# Patient Record
Sex: Male | Born: 1955 | Race: White | Marital: Married | Smoking: Former smoker
Health system: Southern US, Community
[De-identification: ages and names within clinical notes are randomized; demographics above are authoritative.]

## PROBLEM LIST (undated history)

## (undated) DIAGNOSIS — E781 Pure hyperglyceridemia: Secondary | ICD-10-CM

## (undated) DIAGNOSIS — I10 Essential (primary) hypertension: Secondary | ICD-10-CM

## (undated) DIAGNOSIS — F419 Anxiety disorder, unspecified: Secondary | ICD-10-CM

## (undated) DIAGNOSIS — E119 Type 2 diabetes mellitus without complications: Secondary | ICD-10-CM

## (undated) DIAGNOSIS — F329 Major depressive disorder, single episode, unspecified: Secondary | ICD-10-CM

## (undated) DIAGNOSIS — F32A Depression, unspecified: Secondary | ICD-10-CM

## (undated) HISTORY — PX: CHOLECYSTECTOMY: SHX55

## (undated) HISTORY — PX: APPENDECTOMY: SHX54

---

## 2010-09-14 ENCOUNTER — Emergency Department (HOSPITAL_COMMUNITY)
Admission: EM | Admit: 2010-09-14 | Discharge: 2010-09-14 | Disposition: A | Discharge status: Home / Self Care | Payer: BC Managed Care – HMO | Attending: Emergency Medicine | Admitting: Emergency Medicine

## 2010-09-14 ENCOUNTER — Emergency Department (HOSPITAL_COMMUNITY): Payer: BC Managed Care – HMO

## 2010-09-14 DIAGNOSIS — R319 Hematuria, unspecified: Secondary | ICD-10-CM | POA: Insufficient documentation

## 2010-09-14 DIAGNOSIS — F329 Major depressive disorder, single episode, unspecified: Secondary | ICD-10-CM | POA: Insufficient documentation

## 2010-09-14 DIAGNOSIS — I10 Essential (primary) hypertension: Secondary | ICD-10-CM | POA: Insufficient documentation

## 2010-09-14 DIAGNOSIS — R141 Gas pain: Secondary | ICD-10-CM | POA: Insufficient documentation

## 2010-09-14 DIAGNOSIS — R142 Eructation: Secondary | ICD-10-CM | POA: Insufficient documentation

## 2010-09-14 DIAGNOSIS — R110 Nausea: Secondary | ICD-10-CM | POA: Insufficient documentation

## 2010-09-14 DIAGNOSIS — R109 Unspecified abdominal pain: Secondary | ICD-10-CM | POA: Insufficient documentation

## 2010-09-14 DIAGNOSIS — E7800 Pure hypercholesterolemia, unspecified: Secondary | ICD-10-CM | POA: Insufficient documentation

## 2010-09-14 DIAGNOSIS — F3289 Other specified depressive episodes: Secondary | ICD-10-CM | POA: Insufficient documentation

## 2010-09-14 DIAGNOSIS — N329 Bladder disorder, unspecified: Secondary | ICD-10-CM | POA: Insufficient documentation

## 2010-09-14 DIAGNOSIS — R197 Diarrhea, unspecified: Secondary | ICD-10-CM | POA: Insufficient documentation

## 2010-09-14 LAB — COMPREHENSIVE METABOLIC PANEL
BUN: 15 mg/dL (ref 6–23)
CO2: 27 mEq/L (ref 19–32)
Chloride: 102 mEq/L (ref 96–112)
Creatinine, Ser: 0.82 mg/dL (ref 0.4–1.5)
GFR calc non Af Amer: >60 mL/min (ref >60)
Glucose, Bld: 116 mg/dL — ABNORMAL HIGH (ref 70–99)
Total Bilirubin: 0.3 mg/dL (ref 0.3–1.2)

## 2010-09-14 LAB — CBC
Hemoglobin: 14.7 g/dL (ref 13.0–17.0)
MCH: 30.7 pg (ref 26.0–34.0)
MCV: 88.1 fL (ref 78.0–100.0)
Platelets: 226 10*3/uL (ref 150–400)
RBC: 4.79 MIL/uL (ref 4.22–5.81)

## 2010-09-14 LAB — URINE MICROSCOPIC-ADD ON

## 2010-09-14 LAB — DIFFERENTIAL
Eosinophils Absolute: 0.3 10*3/uL (ref 0.0–0.7)
Lymphs Abs: 2 10*3/uL (ref 0.7–4.0)
Monocytes Relative: 6 % (ref 3–12)
Neutrophils Relative %: 68 % (ref 43–77)

## 2010-09-14 LAB — URINALYSIS, ROUTINE W REFLEX MICROSCOPIC
Protein, ur: 100 mg/dL — AB
Urobilinogen, UA: 1 mg/dL (ref 0.0–1.0)

## 2010-09-16 NOTE — Consult Note (Signed)
  NAME:  LIGE, LAKEMAN NO.:  [id number]  MEDICAL RECORD NO.:  [id number]           PATIENT TYPE:  E  LOCATION:  [facility]                         FACILITY:  [facility]  PHYSICIAN:  Tahje Borawski I. Patsi Sears, M.D.DATE OF BIRTH:  1956-03-17  DATE OF CONSULTATION:  09/14/2010 DATE OF DISCHARGE:  09/14/2010                                CONSULTATION   SUBJECTIVE:  Mr. Kinsella is a 54 year old married white male from [place], [place], Art gallery manager for [employer].  He was seen today at [facility] Emergency Room with his second episode of gross stomach pain with hematuria.  No history of kidney stones.  He has a history of tobacco use 35-pack-year, with history of cigars one-third pack per day as well.  He has a family history that shows no cancer, no kidney stones.  SOCIAL HISTORY:  Current tobacco is none.  Alcohol is occasionally.  The patient is married, with 2 children and 2 grandchildren.  There is no parental history of cancer.  FAMILY HISTORY:  Father died with CVA and mother died of natural causes.  PAST SURGICAL HISTORY:  Significant for cholecystectomy and appendectomy.  LABORATORIES IN THE ER:  Show CT with posterior bladder wall bladder cancer on un-contrasted CT.  PAST MEDICAL HISTORY: 1. Diabetes (insulin dependent). 2. Anxiety. 3. GERD. 4. History of Barrett esophagitis.  MEDICATIONS: 1. Aspirin 81 mg a day. 2. Allopurinol. 3. Voltaren. 4. Lisinopril/hydrochlorothiazide. 5. Xanax. 6. Sertraline. 7. NovoLog insulin 40 units 3 times a day with meals. 8. Lantus insulin 40 units at bedtime. 9. Nexium 1 at bedtime. 10.Cymbalta 1 per day. 11.Abilify 10 mg per day. 12.Metformin 1000 mg twice. 13.Simvastatin 140 mg p.o. at bedtime.  PHYSICAL EXAMINATION:  GENERAL:  A well-developed, well-nourished white male in no acute distress. VITAL SIGNS:  Blood pressure is 130/77, pulse 82, respiratory rate 16. NECK:  Supple, nontender.  No nodes. CHEST:   Clear to P and A. ABDOMEN:  Obese, plus bowel sounds without organomegaly without masses. GENITOURINARY:  Normal male external genitalia. EXTREMITIES:  No cyanosis, no edema. NEUROLOGIC:  Physiologic. PSYCHOLOGIC:  Normal.  IMPRESSION:  CT finding of 2-cm posterior bladder wall bladder cancer. The patient will need cystoscopy in the office as well as CT with contrast to evaluate his upper tracts, and then will need blue light cystoscopy evaluation of TUR-bladder tumor.     Myana Schlup I. Patsi Sears, M.D.     SIT/MEDQ  D:  09/14/2010  T:  09/15/2010  Job:  [id number]  Electronically Signed by Jethro Bolus M.D. on 09/16/2010 01:24:11 PM

## 2012-01-27 IMAGING — CT CT ABD-PELV W/O CM
2 of 4 series · 16 of 46 positions shown, 18 images · non-contrast
Comparison: None.

CLINICAL DATA: Hematuria

CT ABDOMEN AND PELVIS WITHOUT CONTRAST
TECHNIQUE: Multidetector CT imaging of the abdomen and pelvis was
performed following the standard protocol without intravenous
contrast.

[Series 2: abd/pelv w/o 5.0 b31f st · axial · non-contrast · 0.98mm/px · z∈[+783,+1268]mm · 13 of 107 slices shown, 15 images]
[im 5/107  soft-tissue]
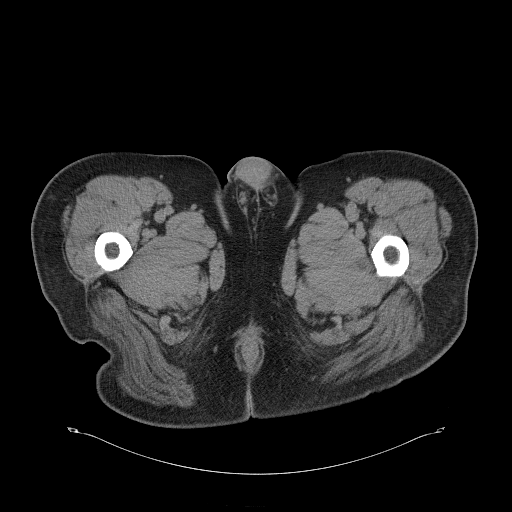
[im 5/107  bone]
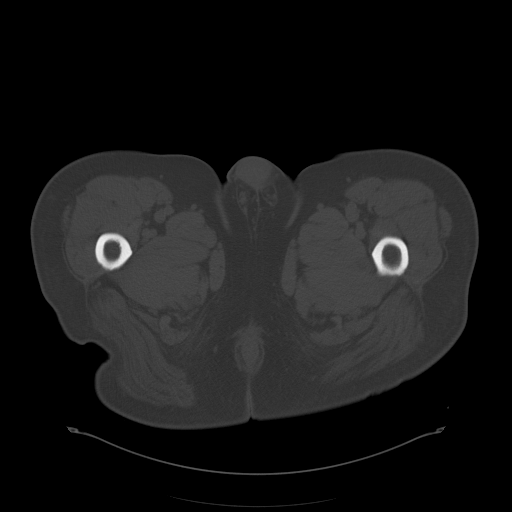
[im 13/107  soft-tissue]
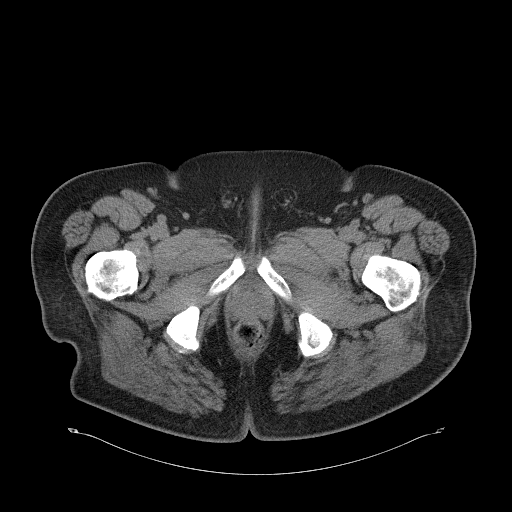
[im 22/107  soft-tissue]
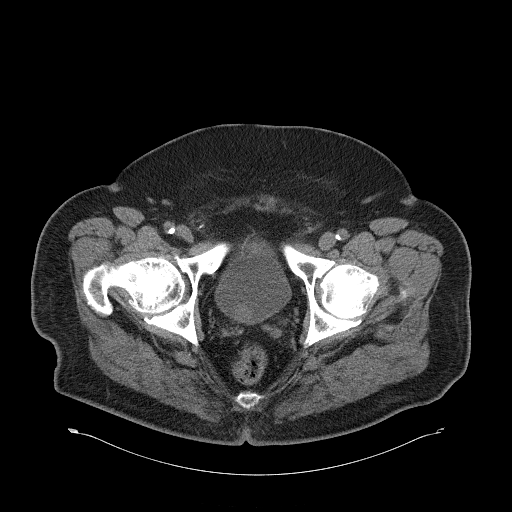
[im 30/107  soft-tissue]
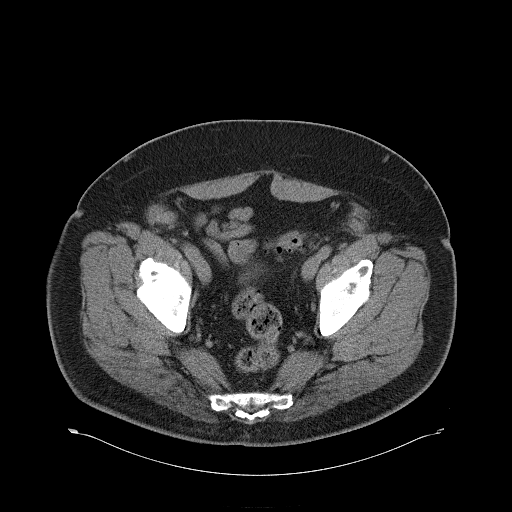
[im 39/107  soft-tissue]
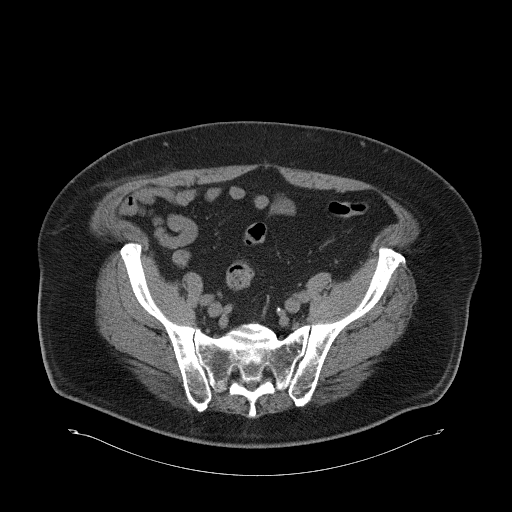
[im 47/107  soft-tissue]
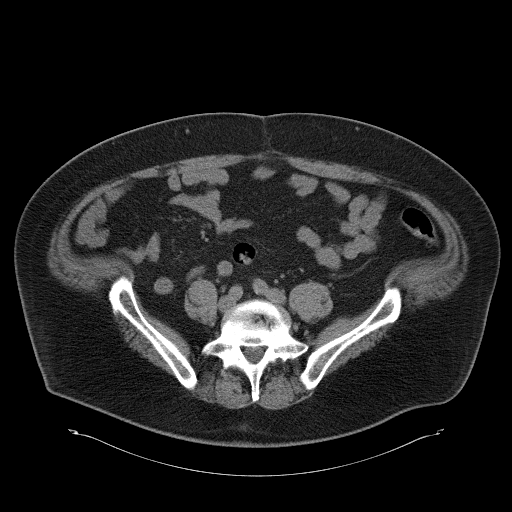
[im 56/107  soft-tissue]
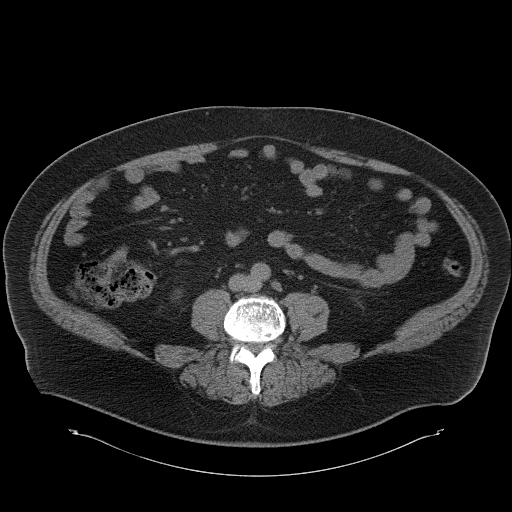
[im 60/107  soft-tissue]
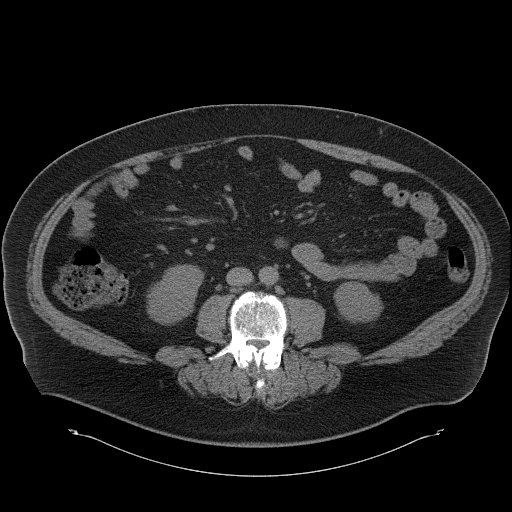
[im 68/107  soft-tissue]
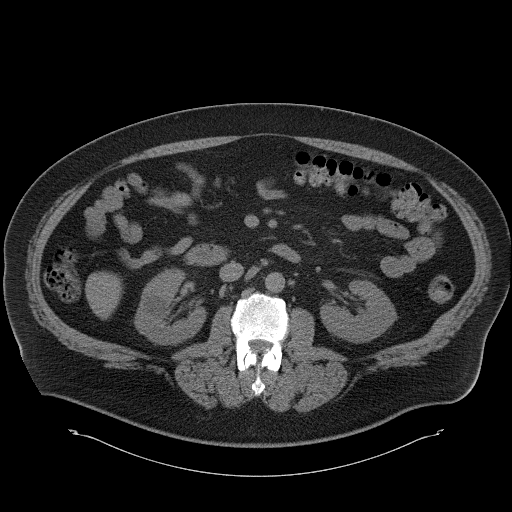
[im 68/107  bone]
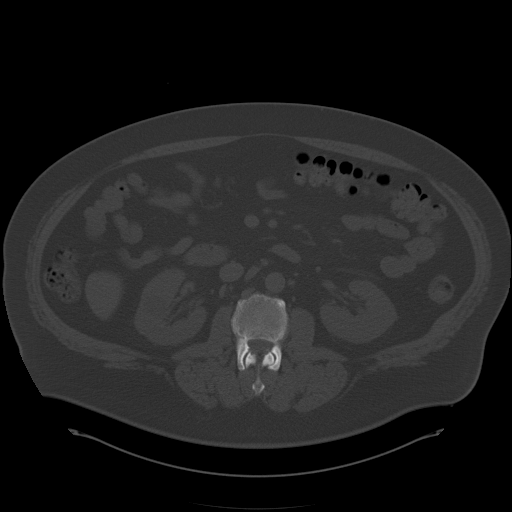
[im 77/107  soft-tissue]
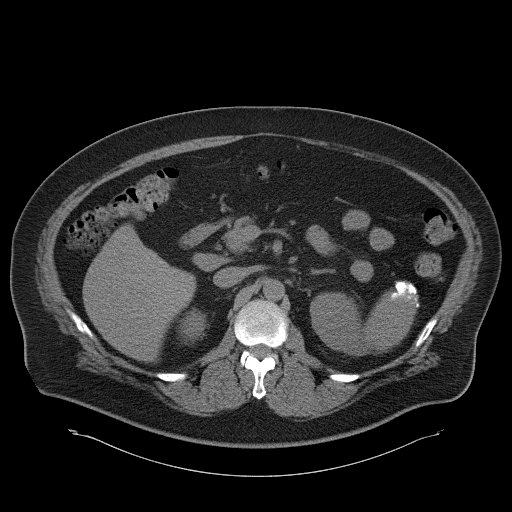
[im 85/107  soft-tissue]
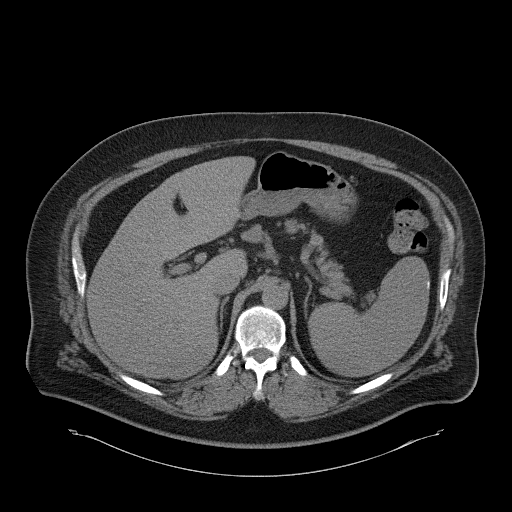
[im 94/107  soft-tissue]
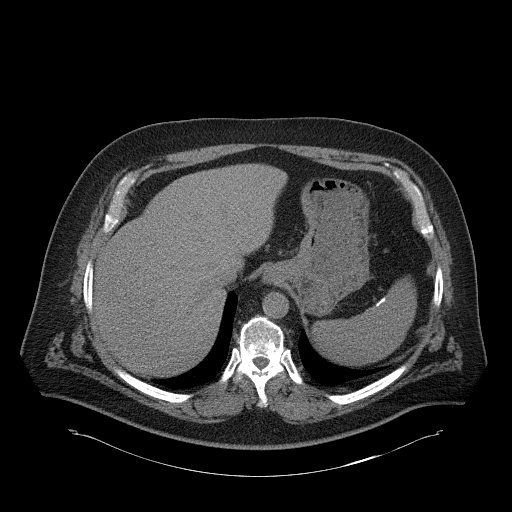
[im 102/107  soft-tissue]
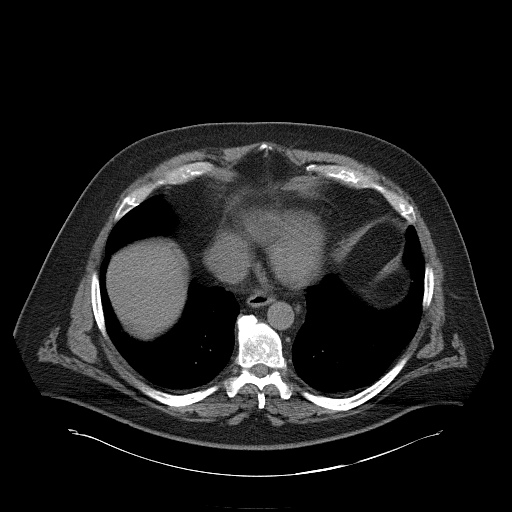

[Series 602: coronal · coronal · 1.04mm/px · 3 of 137 slices shown]
[im 46/137  soft-tissue]
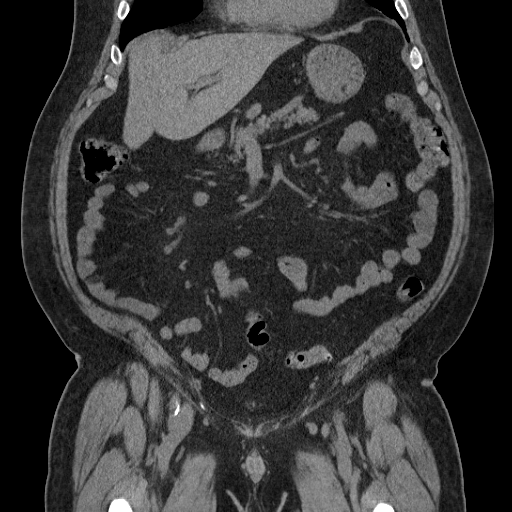
[im 61/137  soft-tissue]
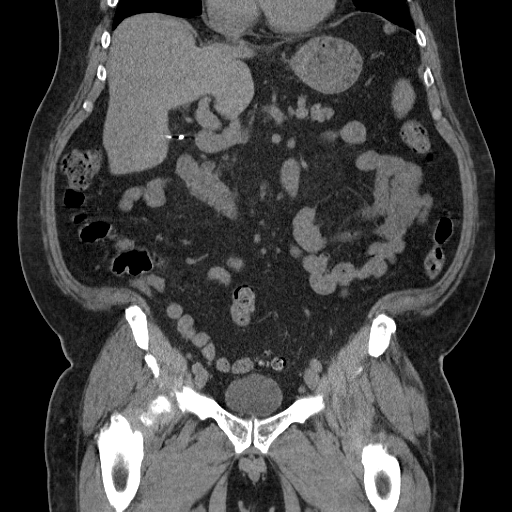
[im 76/137  soft-tissue]
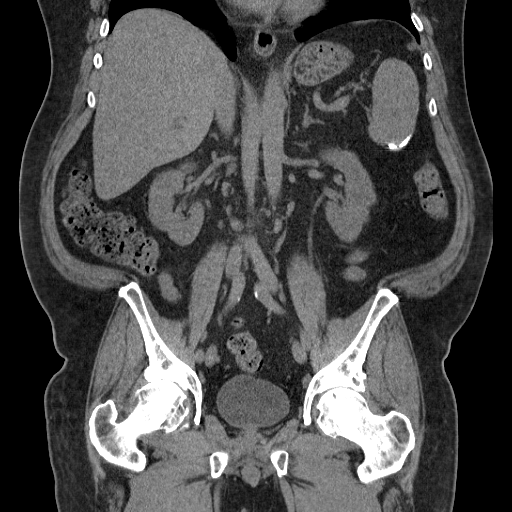

[16 of 46 positions shown; findings below may reference images not displayed]

FINDINGS: The liver has normal uninfused features.  There is some
capsular calcification in the spleen which may be secondary to
prior infection or trauma.  Stomach, stomach is normal.  Small
duodenal diverticulum noted.  The pancreas, adrenal glands, and
abdominal bowel loops are normal in appearance.  The gallbladder is
surgically absent.

3.7 x 2.0 cm water attenuating lesion in the central right kidney
is probably a cyst.  No evidence for stone disease in the right
kidney.  No right hydronephrosis.

The left kidney has normal uninfused features.  Specifically, no
evidence for renal mass or renal stones.  No hydronephrosis.

No abdominal aortic aneurysm.  Scattered lymph nodes are seen in
the retroperitoneal space, but no individual node meets CT criteria
for pathologic enlargement.  There is borderline enlarged lymph
nodes in the gastrohepatic ligament.

Imaging through the pelvis shows no intraperitoneal free fluid.
There is no pelvic sidewall lymphadenopathy.  Subtle 1.7 x 1.9 cm
soft tissue nodule is seen along the posterior right paramidline
bladder wall.  Diverticular disease characterizes the sigmoid colon
without diverticulitis.  The terminal ileum is normal. The appendix
is not visualized, but there is no edema or inflammation in the
region of the cecum.

Bone windows reveal no worrisome lytic or sclerotic osseous
lesions.
IMPRESSION: No evidence for urinary stone disease.  The patient does have a 19
mm soft tissue nodule associated with the posterior right bladder
wall, suspicious for a uroepithelial neoplasm.

I personally called these results to Bhebhe, the PA caring for
the patient in the emergency room, at 4743 hours on 09/14/2010.

## 2014-11-30 ENCOUNTER — Encounter (HOSPITAL_BASED_OUTPATIENT_CLINIC_OR_DEPARTMENT_OTHER): Payer: Self-pay | Admitting: *Deleted

## 2014-11-30 ENCOUNTER — Emergency Department (HOSPITAL_BASED_OUTPATIENT_CLINIC_OR_DEPARTMENT_OTHER)
Admission: EM | Admit: 2014-11-30 | Discharge: 2014-11-30 | Disposition: A | Discharge status: Home / Self Care | Payer: BLUE CROSS/BLUE SHIELD | Attending: Emergency Medicine | Admitting: Emergency Medicine

## 2014-11-30 DIAGNOSIS — R739 Hyperglycemia, unspecified: Secondary | ICD-10-CM

## 2014-11-30 DIAGNOSIS — F419 Anxiety disorder, unspecified: Secondary | ICD-10-CM | POA: Insufficient documentation

## 2014-11-30 DIAGNOSIS — I10 Essential (primary) hypertension: Secondary | ICD-10-CM | POA: Insufficient documentation

## 2014-11-30 DIAGNOSIS — F329 Major depressive disorder, single episode, unspecified: Secondary | ICD-10-CM | POA: Diagnosis not present

## 2014-11-30 DIAGNOSIS — Z87891 Personal history of nicotine dependence: Secondary | ICD-10-CM | POA: Diagnosis not present

## 2014-11-30 DIAGNOSIS — Z79899 Other long term (current) drug therapy: Secondary | ICD-10-CM | POA: Insufficient documentation

## 2014-11-30 DIAGNOSIS — E1165 Type 2 diabetes mellitus with hyperglycemia: Secondary | ICD-10-CM | POA: Diagnosis not present

## 2014-11-30 HISTORY — DX: Pure hyperglyceridemia: E78.1

## 2014-11-30 HISTORY — DX: Anxiety disorder, unspecified: F41.9

## 2014-11-30 HISTORY — DX: Essential (primary) hypertension: I10

## 2014-11-30 HISTORY — DX: Type 2 diabetes mellitus without complications: E11.9

## 2014-11-30 HISTORY — DX: Major depressive disorder, single episode, unspecified: F32.9

## 2014-11-30 HISTORY — DX: Depression, unspecified: F32.A

## 2014-11-30 LAB — BASIC METABOLIC PANEL
ANION GAP: 9 (ref 5–15)
BUN: 21 mg/dL — ABNORMAL HIGH (ref 6–20)
CO2: 24 mmol/L (ref 22–32)
Calcium: 10.3 mg/dL (ref 8.9–10.3)
Chloride: 100 mmol/L — ABNORMAL LOW (ref 101–111)
Creatinine, Ser: 1.11 mg/dL (ref 0.61–1.24)
GFR calc Af Amer: >60 mL/min (ref >60)
Glucose, Bld: 457 mg/dL — ABNORMAL HIGH (ref 65–99)
POTASSIUM: 3.9 mmol/L (ref 3.5–5.1)
SODIUM: 133 mmol/L — AB (ref 135–145)

## 2014-11-30 LAB — CBC
HEMATOCRIT: 38.7 % — AB (ref 39.0–52.0)
HEMOGLOBIN: 13.5 g/dL (ref 13.0–17.0)
MCH: 31 pg (ref 26.0–34.0)
MCHC: 34.9 g/dL (ref 30.0–36.0)
MCV: 88.8 fL (ref 78.0–100.0)
Platelets: 267 10*3/uL (ref 150–400)
RBC: 4.36 MIL/uL (ref 4.22–5.81)
RDW: 14.3 % (ref 11.5–15.5)
WBC: 6.2 10*3/uL (ref 4.0–10.5)

## 2014-11-30 LAB — URINE MICROSCOPIC-ADD ON

## 2014-11-30 LAB — URINALYSIS, ROUTINE W REFLEX MICROSCOPIC
BILIRUBIN URINE: NEGATIVE
Glucose, UA: >1000 mg/dL — AB
Hgb urine dipstick: NEGATIVE
Ketones, ur: NEGATIVE mg/dL
NITRITE: NEGATIVE
Protein, ur: NEGATIVE mg/dL
SPECIFIC GRAVITY, URINE: 1.029 (ref 1.005–1.030)
UROBILINOGEN UA: 0.2 mg/dL (ref 0.0–1.0)
pH: 6 (ref 5.0–8.0)

## 2014-11-30 LAB — CBG MONITORING, ED
GLUCOSE-CAPILLARY: 450 mg/dL — AB (ref 65–99)
Glucose-Capillary: 395 mg/dL — ABNORMAL HIGH (ref 65–99)
Glucose-Capillary: 216 mg/dL — ABNORMAL HIGH (ref 65–99)

## 2014-11-30 MED ORDER — SODIUM CHLORIDE 0.9 % IV BOLUS (SEPSIS)
1000.0000 mL | Freq: Once | INTRAVENOUS | Status: AC
Start: 2014-11-30 — End: 2014-11-30
  Administered 2014-11-30: 1000 mL via INTRAVENOUS

## 2014-11-30 MED ORDER — INSULIN ASPART 100 UNIT/ML IV SOLN
10.0000 [IU] | Freq: Once | INTRAVENOUS | Status: AC
  Administered 2014-11-30: 10 [IU] via INTRAVENOUS
  Filled 2014-11-30: qty 1

## 2014-11-30 NOTE — ED Provider Notes (Signed)
CSN: [id number]     Arrival date & time 11/30/14  1548 History  This chart was scribed for Mirian Mo, MD by Lyndel Safe, ED Scribe. This patient was seen in room MH02/MH02 and the patient's care was started 4:23 PM.   Chief Complaint  Patient presents with  . Hyperglycemia   Patient is a 59 y.o. male presenting with hyperglycemia. The history is provided by the patient. No language interpreter was used.  Hyperglycemia Blood sugar level PTA:  600 Severity:  Moderate Onset quality:  Sudden Timing:  Constant Progression:  Waxing and waning Chronicity:  New Diabetes status:  Controlled with insulin Context comment:  Drank a coke with lunch Relieved by:  Nothing Ineffective treatments:  Insulin Associated symptoms: blurred vision    HPI Comments: Harold Pierce is a 59 y.o. male, with a PMhx of DM and HTN, who presents to the Emergency Department complaining of sudden onset, moderate, waxing and waning hyperglycemia reading over 600 PTA. He reports associated blurred vision that has resolved. Pt states his fasting CBG was 360 this morning and before lunch his CBG was in the 400s. He notes that today during lunch he drank a coke and has since been unable to lower his blood glucose using his sliding scale insulin. Pt denies missing any medication doses recently. He has an appointment in September with an endocrinologist and is followed by a PCP for his DM. Denies any recent illness.   Past Medical History  Diagnosis Date  . Diabetes mellitus without complication   . Hypertension   . High triglycerides   . Anxiety   . Depression    Past Surgical History  Procedure Laterality Date  . Cholecystectomy    . Appendectomy     No family history on file. Social History  Substance Use Topics  . Smoking status: Former Games developer  . Smokeless tobacco: None  . Alcohol Use: Yes     Comment: occaional    Review of Systems  Eyes: Positive for blurred vision and visual disturbance.  All  other systems reviewed and are negative.  Allergies  Fish-derived products  Home Medications   Prior to Admission medications   Medication Sig Start Date End Date Taking? Authorizing Provider  ALPRAZolam (XANAX PO) Take by mouth.   Yes Historical Provider, MD  ARIPiprazole (ABILIFY) 10 MG tablet Take 10 mg by mouth daily.   Yes Historical Provider, MD  DULoxetine HCl (CYMBALTA PO) Take by mouth.   Yes Historical Provider, MD  Esomeprazole Magnesium (NEXIUM PO) Take by mouth.   Yes Historical Provider, MD  Fenofibrate (TRICOR PO) Take by mouth.   Yes Historical Provider, MD  INSULIN REGULAR HUMAN, CONC, [place] Inject into the skin.   Yes Historical Provider, MD  LISINOPRIL PO Take by mouth.   Yes Historical Provider, MD   BP 140/81 mmHg  Pulse 82  Temp(Src) 98.3 F (36.8 C) (Oral)  Resp 18  Ht 6\' 1"  (1.854 m)  Wt 290 lb (131.543 kg)  BMI 38.27 kg/m2  SpO2 97% Physical Exam  Constitutional: He is oriented to person, place, and time. He appears well-developed and well-nourished.  HENT:  Head: Normocephalic and atraumatic.  Eyes: Conjunctivae and EOM are normal.  Neck: Normal range of motion. Neck supple.  Cardiovascular: Normal rate, regular rhythm and normal heart sounds.   Pulmonary/Chest: Effort normal and breath sounds normal. No respiratory distress.  Abdominal: He exhibits no distension. There is no tenderness. There is no rebound and no guarding.  Musculoskeletal: Normal  range of motion.  Neurological: He is alert and oriented to person, place, and time.  Skin: Skin is warm and dry.  Vitals reviewed.   ED Course  Procedures  DIAGNOSTIC STUDIES: Oxygen Saturation is 97% on RA, normal by my interpretation.    COORDINATION OF CARE: 4:26 PM Discussed treatment plan which includes to order IV fluids with pt. Current CBG is 450. Pt acknowledges and agrees to plan.   Labs Review Labs Reviewed  BASIC METABOLIC PANEL - Abnormal; Notable for the following:    Sodium 133 (*)     Chloride 100 (*)    Glucose, Bld 457 (*)    BUN 21 (*)    All other components within normal limits  CBC - Abnormal; Notable for the following:    HCT 38.7 (*)    All other components within normal limits  URINALYSIS, ROUTINE W REFLEX MICROSCOPIC (NOT AT [facility]) - Abnormal; Notable for the following:    Glucose, UA >1000 (*)    Leukocytes, UA MODERATE (*)    All other components within normal limits  URINE MICROSCOPIC-ADD ON - Abnormal; Notable for the following:    Squamous Epithelial / LPF FEW (*)    Bacteria, UA FEW (*)    All other components within normal limits  CBG MONITORING, ED - Abnormal; Notable for the following:    Glucose-Capillary 450 (*)    All other components within normal limits  CBG MONITORING, ED - Abnormal; Notable for the following:    Glucose-Capillary 395 (*)    All other components within normal limits  CBG MONITORING, ED - Abnormal; Notable for the following:    Glucose-Capillary 216 (*)    All other components within normal limits    Imaging Review No results found.   EKG Interpretation None      MDM   Final diagnoses:  Hyperglycemia    59 y.o. male with pertinent PMH of DM presents with hyperglycemia at home to >600.  He felt malaise, otherwise with that was without symptoms. He states that he was noncompliant with diet, drank a soft drink with lunch. On arrival here vital signs and physical exam as above. Workup should hyperglycemia without DKA. He is given normal serum bolus with some improvement, however then required IV insulin. This completely resolved his hyperglycemia. Discharged home in stable condition..    I have reviewed all laboratory and imaging studies if ordered as above  1. Hyperglycemia           Mirian Mo, MD 12/01/14 [phone]

## 2014-11-30 NOTE — Discharge Instructions (Signed)

## 2014-11-30 NOTE — ED Notes (Signed)
Hyperglycemia. States his blood sugar is over 600. Blurred vision.

## 2021-11-15 DEATH — deceased
